# Patient Record
Sex: Female | Born: 1935 | Race: White | Hispanic: No | Marital: Married
Health system: Southern US, Community
[De-identification: ages and names within clinical notes are randomized; demographics above are authoritative.]

---

## 2005-03-23 ENCOUNTER — Ambulatory Visit: Payer: Self-pay | Admitting: Internal Medicine

## 2007-11-22 ENCOUNTER — Other Ambulatory Visit: Payer: Self-pay

## 2007-11-22 ENCOUNTER — Ambulatory Visit: Payer: Self-pay | Admitting: Ophthalmology

## 2007-11-28 ENCOUNTER — Ambulatory Visit: Payer: Self-pay | Admitting: Ophthalmology

## 2010-03-15 ENCOUNTER — Ambulatory Visit: Payer: Self-pay | Admitting: Internal Medicine

## 2010-03-18 ENCOUNTER — Inpatient Hospital Stay: Payer: Self-pay | Admitting: Internal Medicine

## 2010-04-07 ENCOUNTER — Ambulatory Visit: Payer: Self-pay | Admitting: Internal Medicine

## 2010-04-09 ENCOUNTER — Encounter: Payer: Self-pay | Admitting: Internal Medicine

## 2010-04-15 ENCOUNTER — Ambulatory Visit: Payer: Self-pay | Admitting: Internal Medicine

## 2010-04-15 ENCOUNTER — Encounter: Payer: Self-pay | Admitting: Internal Medicine

## 2010-05-05 ENCOUNTER — Ambulatory Visit: Payer: Self-pay | Admitting: Internal Medicine

## 2010-05-15 ENCOUNTER — Encounter: Payer: Self-pay | Admitting: Internal Medicine

## 2010-05-15 ENCOUNTER — Ambulatory Visit: Payer: Self-pay | Admitting: Internal Medicine

## 2010-05-29 ENCOUNTER — Ambulatory Visit: Payer: Self-pay | Admitting: Internal Medicine

## 2010-06-15 ENCOUNTER — Ambulatory Visit: Payer: Self-pay | Admitting: Internal Medicine

## 2011-04-17 ENCOUNTER — Emergency Department: Payer: Self-pay | Admitting: Internal Medicine

## 2011-09-30 ENCOUNTER — Emergency Department: Payer: Self-pay | Admitting: Emergency Medicine

## 2011-10-02 ENCOUNTER — Inpatient Hospital Stay: Payer: Self-pay | Admitting: Internal Medicine

## 2011-10-06 ENCOUNTER — Encounter: Payer: Self-pay | Admitting: Internal Medicine

## 2011-10-19 ENCOUNTER — Encounter: Payer: Self-pay | Admitting: Internal Medicine

## 2011-11-16 ENCOUNTER — Ambulatory Visit: Payer: Self-pay | Admitting: Internal Medicine

## 2011-11-16 LAB — COMPREHENSIVE METABOLIC PANEL
Bilirubin,Total: 1.5 mg/dL — ABNORMAL HIGH (ref 0.2–1.0)
Calcium, Total: 10.1 mg/dL (ref 8.5–10.1)
Chloride: 95 mmol/L — ABNORMAL LOW (ref 98–107)
Co2: 22 mmol/L (ref 21–32)
Creatinine: 1.12 mg/dL (ref 0.60–1.30)
EGFR (African American): 59 — ABNORMAL LOW
EGFR (Non-African Amer.): 49 — ABNORMAL LOW
Osmolality: 279 (ref 275–301)
Potassium: 5.7 mmol/L — ABNORMAL HIGH (ref 3.5–5.1)
Sodium: 134 mmol/L — ABNORMAL LOW (ref 136–145)

## 2011-11-16 LAB — CBC
MCH: 30.2 pg (ref 26.0–34.0)
MCHC: 33 g/dL (ref 32.0–36.0)
MCV: 92 fL (ref 80–100)
WBC: 13.8 10*3/uL — ABNORMAL HIGH (ref 3.6–11.0)

## 2011-11-16 LAB — URINALYSIS, COMPLETE
Bilirubin,UR: NEGATIVE
Bilirubin,UR: NEGATIVE
Glucose,UR: 50 mg/dL (ref 0–75)
Glucose,UR: 50 mg/dL (ref 0–75)
Ph: 5 (ref 4.5–8.0)
Protein: 100
RBC,UR: 9 /HPF (ref 0–5)
RBC,UR: 2 /HPF (ref 0–5)
Specific Gravity: 1.026 (ref 1.003–1.030)
Specific Gravity: 1.025 (ref 1.003–1.030)
Squamous Epithelial: 15
WBC UR: 76 /HPF (ref 0–5)
WBC UR: 2 /HPF (ref 0–5)

## 2011-11-16 LAB — DRUG SCREEN, URINE
Barbiturates, Ur Screen: NEGATIVE (ref ?–200)
Cocaine Metabolite,Ur ~~LOC~~: NEGATIVE (ref ?–300)
MDMA (Ecstasy)Ur Screen: NEGATIVE (ref ?–500)
Opiate, Ur Screen: NEGATIVE (ref ?–300)
Phencyclidine (PCP) Ur S: NEGATIVE (ref ?–25)
Tricyclic, Ur Screen: NEGATIVE (ref ?–1000)

## 2011-11-16 LAB — TROPONIN I: Troponin-I: 0.05 ng/mL

## 2011-11-17 ENCOUNTER — Inpatient Hospital Stay: Payer: Self-pay | Admitting: Internal Medicine

## 2011-11-17 LAB — URINE CULTURE

## 2011-11-18 ENCOUNTER — Encounter: Payer: Self-pay | Admitting: Internal Medicine

## 2011-11-22 LAB — CULTURE, BLOOD (SINGLE)

## 2011-12-17 ENCOUNTER — Ambulatory Visit: Payer: Self-pay | Admitting: Internal Medicine

## 2011-12-17 ENCOUNTER — Encounter: Payer: Self-pay | Admitting: Internal Medicine

## 2011-12-17 DEATH — deceased

## 2013-04-25 IMAGING — CT CT HEAD WITHOUT CONTRAST
3 of 4 series · 17 of 30 positions shown, 19 images · non-contrast
Comparison: none

REASON FOR EXAM: ams
COMMENTS:   May transport without cardiac monitor

PROCEDURE:     CT  - CT HEAD WITHOUT CONTRAST  - November 16, 2011  [DATE]
RESULT:     Comparison:  10/02/2011
TECHNIQUE: Multiple axial images from the foramen magnum to the vertex were
obtained without IV contrast.

[Series 2: without · axial · non-contrast · 0.42mm/px · z∈[+422,+538]mm · 8 of 31 slices shown, 10 images (1 of 2)]
[im 4/31  brain]
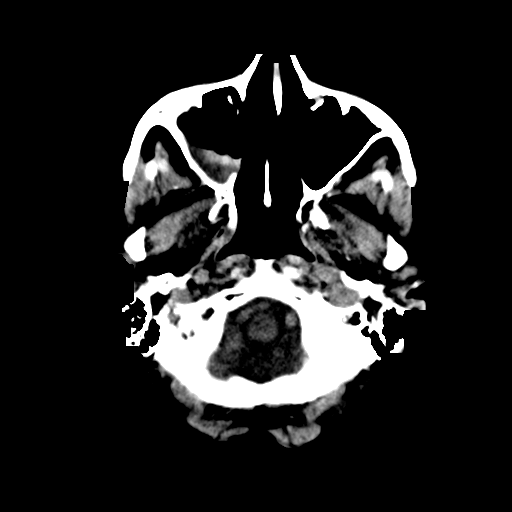
[im 4/31  bone]
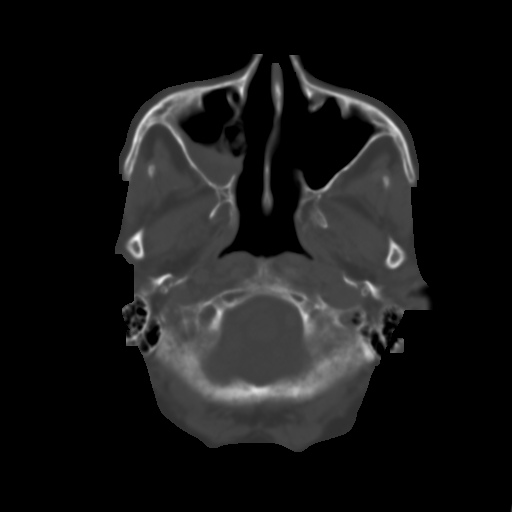
[im 7/31  brain]
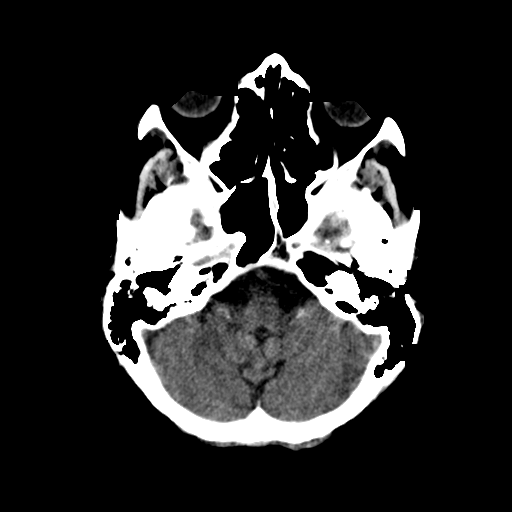
[im 11/31  brain]
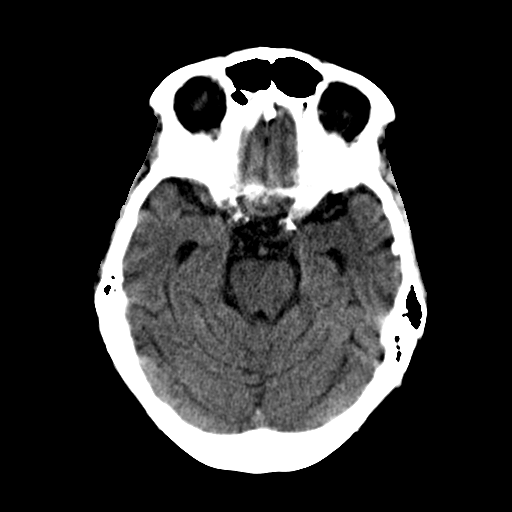
[im 14/31  brain]
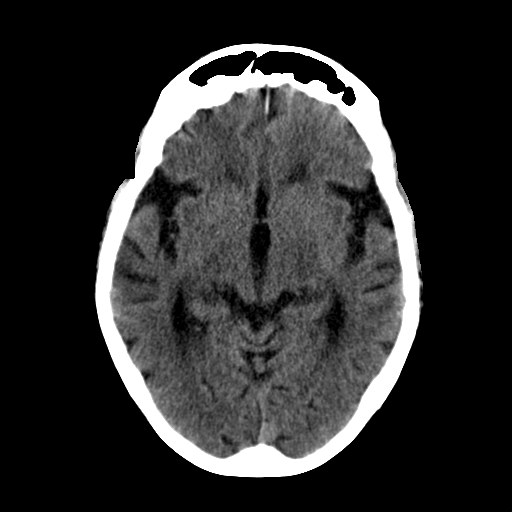
[im 17/31  brain]
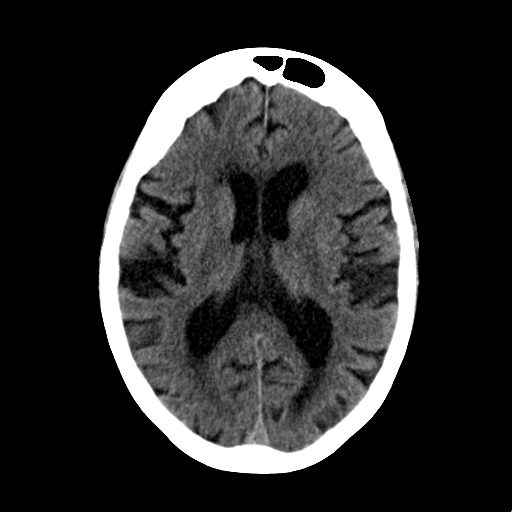
[im 17/31  bone]
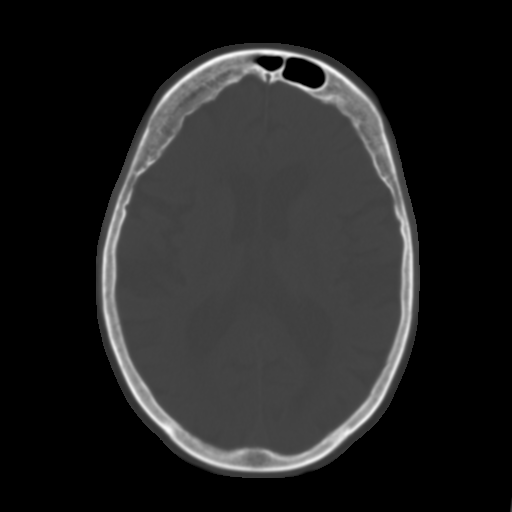
[im 21/31  brain]
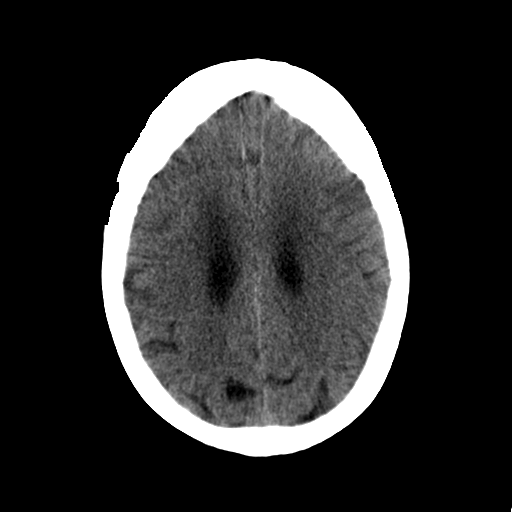
[im 24/31  brain]
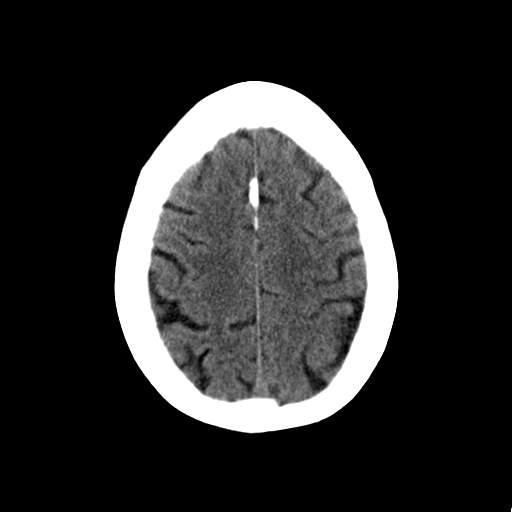
[im 27/31  brain]
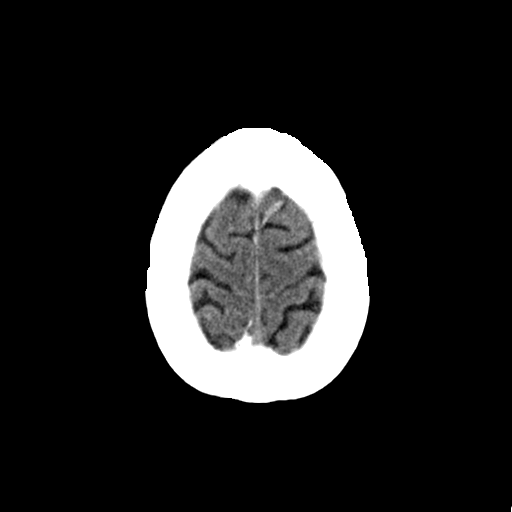

[Series 3: bone · axial · 0.42mm/px · z∈[+422,+508]mm · 6 of 31 slices shown]
[im 4/31  bone]
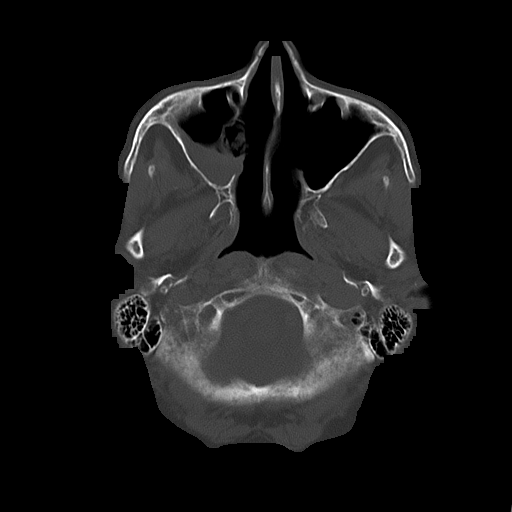
[im 7/31  bone]
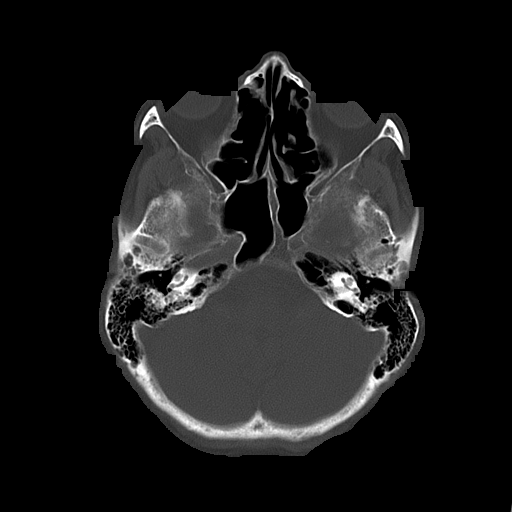
[im 11/31  bone]
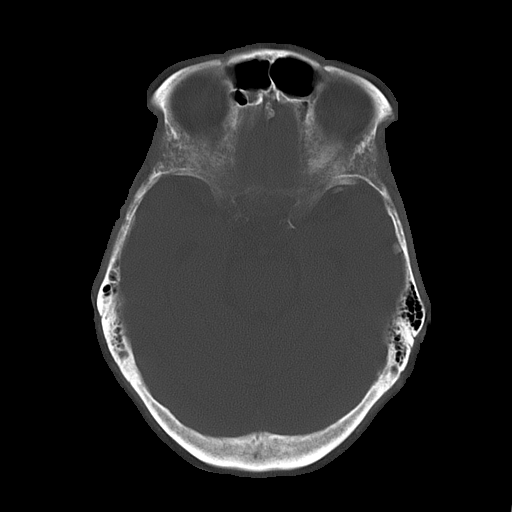
[im 14/31  bone]
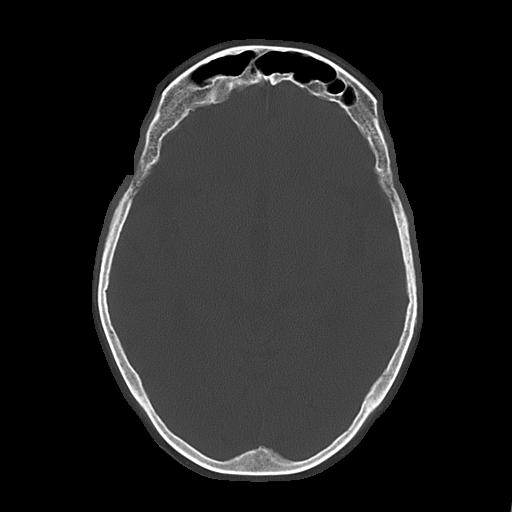
[im 17/31  bone]
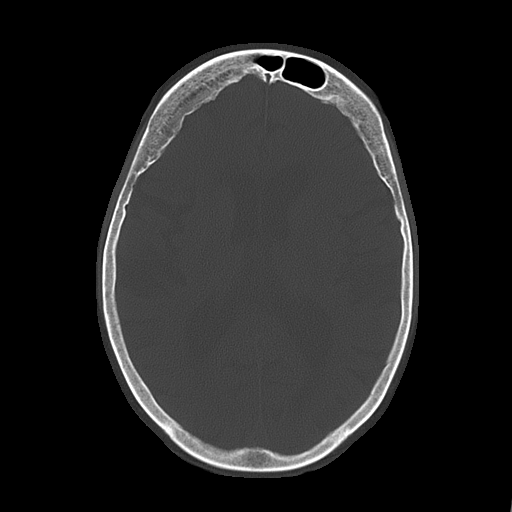
[im 21/31  bone]
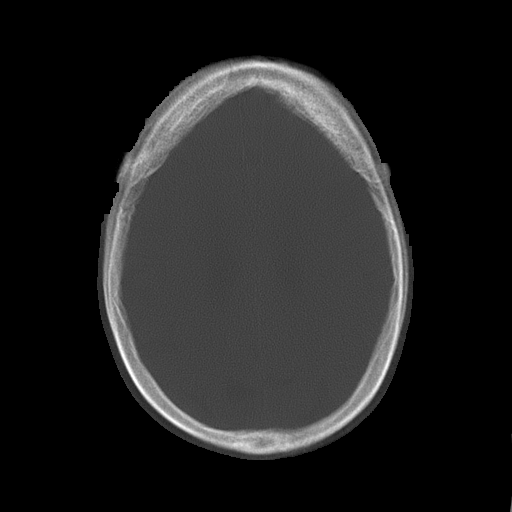

[Series 4: without · axial · non-contrast · 0.42mm/px · z∈[+497,+537]mm · 3 of 16 slices shown (2 of 2)]
[im 4/16  brain]
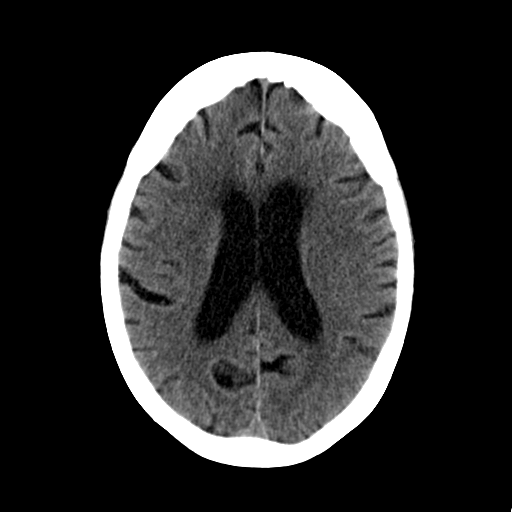
[im 8/16  brain]
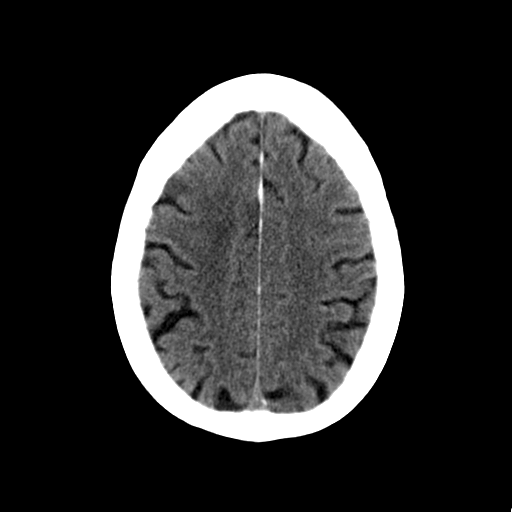
[im 12/16  brain]
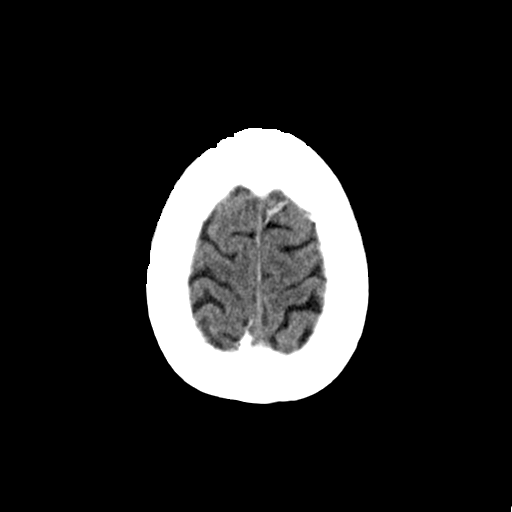

[17 of 30 positions shown; findings below may reference images not displayed]

FINDINGS: The images are degraded by patient motion artifact. Images were repeated of
the mid and upper cerebrum secondary to the patient motion. There is no
evidence for mass effect, midline shift, or extra-axial fluid collections.
There is no evidence for space-occupying lesion, intracranial hemorrhage, or
cortical-based area of infarction. Periventricular and subcortical
hypoattenuation is consistent with chronic small vessel ischemic disease.

There is moderate opacification of the right maxillary sinus, similar to
prior. Postsurgical changes seen from resection of the medial walls of the
bilateral maxillary sinuses. There is mild opacification of the right
anterior ethmoid air cells.

The osseous structures are unremarkable.
IMPRESSION: 1. No acute intracranial process.
2. Chronic small vessel ischemic disease.
3. Right-sided paranasal sinus disease.

## 2015-03-09 NOTE — H&P (Signed)
PATIENT NAME:  Katherine Noble, Acelynn MR#:  [id number] DATE OF BIRTH:  [birth year]  DATE OF ADMISSION:  11/17/2011  PRIMARY CARE PHYSICIAN: Alonna BucklerAndrew Lamb, MD  EMERGENCY ROOM PHYSICIAN: Joseph ArtAlice Finnell, MD  CHIEF COMPLAINT: Confusion, shortness of breath.   HISTORY OF PRESENT ILLNESS: The patient is an 81 year old female with past medical history significant for coronary artery disease and hypertension. She presents with the chief complaint of shortness of breath. Symptoms have been going on for three days. Shortness of breath is worse today. She has been increasingly more confused. The patient has had poor appetite for the last couple of months. Her oxygenation dropped on exertion. She has been unable to speak. She has been weak to the point where she cannot walk. The patient has history of left humeral fracture five weeks ago. She was hospitalized. She went to a rehabilitation center. Per the patient's family, she has never been on oxygen and at the rehab center she is requiring continuous oxygen.   PAST MEDICAL HISTORY:  1. Coronary artery disease.  2. Myocardial infarction with stent placement.  3. Systemic hypertension. 4. Head concussion.   PAST SURGICAL HISTORY:  1. Polyp removal.  2. History of back surgery. 3. History of left leg trauma from accident with lawnmower resulting in some left leg weakness.  SOCIAL HISTORY: No history of tobacco abuse, alcohol abuse, or drug use.   FAMILY HISTORY: Negative for stroke and diabetes.   ALLERGIES: Aspirin, nonsteroidal anti-inflammatory medications causing skin rash and nonsteroidals also causing shortness of breath, and Lyrica causing double vision. She is allergic to amlodipine.   MEDICATIONS:  1. Cepacol every 12 hours p.r.n. for sore throat.  2. Ipratropium albuterol every four hours p.r.n.  3. Tramadol 50 to 100 mg every four hours p.r.n. 4. Lotrisone cream topical twice a day p.r.n.  5. Klonopin 0.5 mg p.o. every 8 hours p.r.n. 6. Advair  Diskus 250/50 one puff every 12 hours.  7. Clonazepam 0.5 mg p.o. nightly, anxiety. 8. Plavix 75 mg p.o. daily.  9. Cymbalta 60 mg p.o. daily. 10. Lidoderm 5% patch apply to arm in the a.m. 11. Metoprolol 50 mg p.o. twice a day. 12. Protonix 40 mg p.o. daily.  13. Prednisone 5 mg Monday through Friday.  14. Spiriva one inhalation daily.  15. Trazodone 50 mg 1-1/2 tablets p.o. nightly.  16. Vitamin B-12 250 mcg p.o. daily.   REVIEW OF SYSTEMS: CONSTITUTIONAL: The patient denies any fevers, chills, or night sweats. HEENT: The patient denies any hearing loss, dysphagia, visual problems, or sore throat. CARDIOVASCULAR: The patient denies any chest pain, orthopnea, or paroxysmal nocturnal dyspnea. RESPIRATORY: The patient denied any hemoptysis. GI: No nausea, vomiting, abdominal pain, hematemesis, hematochezia, or melena. GU: No hematuria, dysuria, or frequency. NEUROLOGIC: No headache, focal weakness, or seizures. SKIN: No lesions. No rash. ENDOCRINE: No polyuria, polyphagia, or polydipsia. MUSCULOSKELETAL: No arthritis or joint effusion.   PHYSICAL EXAMINATION:   GENERAL: The patient is lethargic.  VITALS: Temperature 97.9, heart rate 112, respirations 22, blood pressure 167/88, and oxygen saturation 98%.   HEENT: Atraumatic, normocephalic. Pupils equal, round, and reactive to light. Extraocular movements intact. Sclerae anicteric. Mucous membranes dry.  NECK: Supple. No thyromegaly.   HEART: S1 and S2 regular rate, rhythm. No gallops. No thrills. No murmurs.  LUNGS:  No rales, no rhonchi, no wheezes, and no bronchial breath sounds.   GI: Abdomen is soft, nontender, and nondistended. Normal bowel sounds. No hepatosplenomegaly.   GU: No hematuria or masses are noted.   SKIN:  No lesions or rash.   ENDOCRINE: No masses and no thyromegaly.   LYMPH: No lymphadenopathy or nodes palpable.   NEURO: Cranial nerves II through XII are grossly intact. Motor strength is 5 out of 5 in bilateral  upper and lower extremities. Sensation is within normal limits. No focal neurological deficits are noted on examination.   MUSCULOSKELETAL: No arthritis, joint effusion, or swelling.   HEMATOLOGIC: No ecchymosis, no bleeding, and no petechiae.   EXTREMITIES: No cyanosis, no clubbing, and no edema. 2+ pedal pulses are noted bilaterally.   LABS/STUDIES: CT of the brain is negative.  CT of the chest shows bilateral pneumonia. Negative for pulmonary embolism.   Urinalysis shows 36 WBCs and 9 RBCs.   WBC count 13,800, hemoglobin 11.9, hematocrit 36.2, and platelet count 360. Glucose 160, BUN 32, creatinine 1.12, sodium 134, potassium 5.7, chloride 95, CO2 22, calcium 10.1, total bilirubin 1.5, ALT 206, AST 356, total protein 6.5, and albumin 2.9. Estimated GFR 59. Troponin 0.05. PT 16.5. INR 1.3. Toxicology screen is negative.   ABG: pH 7.46, pCO2 26, pO2 66, FiO2 21.   ASSESSMENT AND PLAN:  1. The patient is an 81 year old female who presents with the chief complaint of shortness of breath, acute bilateral pneumonia, and small bilateral pleural effusions: Admit to telemetry, start the patient on pneumonia clinical pathway orders, IV Rocephin, Zithromax, and IV fluids. Follow blood cultures.  2. Hypertension: Continue metoprolol.  3. Acute renal failure, prerenal azotemia: Monitor closely. It will likely improve with IV hydration. 4. Elevated liver function tests: Monitor liver function tests. 5. Hyperkalemia: Monitor potassium and we will recheck in the morning.  6. Mild hyponatremia due to iron depletion: Monitor closely. 7. Anxiety: Continue Xanax.  ____________________________ Donia Ast, MD jsp:slb D: 11/17/2011 01:41:35 ET T: 11/17/2011 09:46:19 ET JOB#: [id number]  cc: Donia Ast, MD, <Dictator> Reola Mosher. Randa Lynn, MD Donia Ast MD ELECTRONICALLY SIGNED 11/17/2011 21:46

## 2015-03-09 NOTE — Discharge Summary (Signed)
PATIENT NAME:  Katherine Noble, Katherine Noble MR#:  [id number] DATE OF BIRTH:  08/12/1921  DATE OF ADMISSION:  11/17/2011 DATE OF DISCHARGE:  11/17/2011  DISCHARGE DIAGNOSIS: Acute respiratory failure.   SECONDARY DIAGNOSES:  1. Coronary artery disease.  2. History of myocardial infarctions status post stenting.  3. Hypertension.  4. Acute renal failure.  5. Elevated liver function tests.  6. Hyperkalemia.  7. Hyponatremia.  8. Anxiety.   CONSULTATION: Palliative Care, Dr. Harvie JuniorPhifer.   PROCEDURES/RADIOLOGY:  1. CT scan of the head without contrast on 01/01 showed no acute intracranial process. Chronic small vessel ischemic disease. Right-sided paranasal sinus disease.  2. CT scan of the chest for PE protocol on 01/01 showed no PE. Small bilateral pleural effusions, left greater than right. Moderate compression fracture in the mid and lower thoracic spine which appeared to be chronic.  3. Chest x-ray on 01/31 showed interstitial pulmonary edema. Small left pleural effusion. Possible small right pleural effusion. Atelectasis.   MAJOR LABORATORY PANEL: Urinalysis on admission was negative. Blood cultures x2 were negative. Urine culture had no growth on the 1st of January.   HISTORY AND SHORT HOSPITAL COURSE: The patient is an 81 year old female with the above-mentioned medical problems who was admitted for acute respiratory failure. She was also confused, was placed on BiPAP. Please see Dr. Eliane DecreePatel's dictated history and physical for further details. On my evaluation, the patient was still having very difficult breathing, was using accessory muscles for respirations, was tachypneic, tachycardic. He was using BiPAP and was not comfortable. Palliative care consult was obtained as per the family request and had a long discussion with Dr. Harvie JuniorPhifer and team and family decided to make her comfort care and transfer to hospice home and she was discharged to the hospice home on 01/02 as a bed was available. She remained  critical.   DISCHARGE MEDICATIONS:  1. Roxanol 0.25 to 0.5 mL p.o./sublingual every one to two hours as needed.  2. Lorazepam 0.5 to 1 mg p.o./sublingual every 2 to 4 hours as needed.  3. Ranitidine 150 mg p.o. b.i.d.  4. ABHR suppository as needed, one per rectum every 4 to 6 hours as needed.   DISCHARGE DIET: As tolerated.   DISCHARGE ACTIVITY: As tolerated.   DISCHARGE INSTRUCTIONS AND FOLLOW-UP:  1. The patient was instructed to use two liters of oxygen via nasal cannula continuous and as needed.  2. Foley catheter to be left indwelling for urinary incontinence/prevention of skin breakdown.  3. Medications to be crushed or use liquid when appropriate. May change to rectal route if unable to swallow.   CODE STATUS: DO NOT RESUSCITATE.   TOTAL TIME DISCHARGING THIS PATIENT: 45 minutes.   ____________________________ Ellamae SiaVipul S. Sherryll BurgerShah, MD vss:ap D: 11/22/2011 12:25:03 ET T: 11/23/2011 13:27:31 ET JOB#: [id number]  cc: Carel Carrier S. Sherryll BurgerShah, MD, <Dictator> Reola MosherAndrew S. Randa LynnLamb, MD Ned GraceNancy Phifer, MD Ellamae SiaVIPUL S [facility] FlHAH MD ELECTRONICALLY SIGNED 11/23/2011 19:52

## 2015-03-09 NOTE — H&P (Signed)
PATIENT NAME:  Tonna BoehringerMURRAY, Oletta MR#:  [id number] DATE OF BIRTH:  07/27/1921  DATE OF ADMISSION:  11/17/2011  ADDENDUM   ASSESSMENT AND PLAN: In the Emergency Room patient became more tachypneic. Although we repeated her ABG which looks a little bit improved. She is starting to use accessory muscles. We will go ahead and place her on BiPAP at this point.   ____________________________ Donia AstJignesh S. Payslee Bateson, MD jsp:cms D: 11/17/2011 05:53:24 ET T: 11/17/2011 09:38:46 ET JOB#: [id number]  cc: Donia AstJignesh S. Bray Vickerman, MD, <Dictator> Donia AstJIGNESH S Emmalise Huard MD ELECTRONICALLY SIGNED 11/17/2011 21:46
